# Patient Record
Sex: Male | Born: 1995 | Race: White | Hispanic: No | Marital: Single | Smoking: Never smoker
Health system: Southern US, Community
[De-identification: ages and names within clinical notes are randomized; demographics above are authoritative.]

## PROBLEM LIST (undated history)

## (undated) DIAGNOSIS — F329 Major depressive disorder, single episode, unspecified: Secondary | ICD-10-CM

## (undated) DIAGNOSIS — F419 Anxiety disorder, unspecified: Secondary | ICD-10-CM

## (undated) DIAGNOSIS — F32A Depression, unspecified: Secondary | ICD-10-CM

## (undated) HISTORY — DX: Anxiety disorder, unspecified: F41.9

## (undated) HISTORY — DX: Major depressive disorder, single episode, unspecified: F32.9

## (undated) HISTORY — DX: Depression, unspecified: F32.A

---

## 2015-05-12 ENCOUNTER — Ambulatory Visit (INDEPENDENT_AMBULATORY_CARE_PROVIDER_SITE_OTHER): Admitting: Family Medicine

## 2015-05-12 ENCOUNTER — Ambulatory Visit (INDEPENDENT_AMBULATORY_CARE_PROVIDER_SITE_OTHER)

## 2015-05-12 VITALS — BP 122/78 | HR 80 | Temp 98.3°F | Resp 16 | Ht 73.5 in | Wt 181.8 lb

## 2015-05-12 DIAGNOSIS — S161XXA Strain of muscle, fascia and tendon at neck level, initial encounter: Secondary | ICD-10-CM

## 2015-05-12 DIAGNOSIS — M25571 Pain in right ankle and joints of right foot: Secondary | ICD-10-CM

## 2015-05-12 DIAGNOSIS — S39012A Strain of muscle, fascia and tendon of lower back, initial encounter: Secondary | ICD-10-CM | POA: Diagnosis not present

## 2015-05-12 DIAGNOSIS — S93401A Sprain of unspecified ligament of right ankle, initial encounter: Secondary | ICD-10-CM

## 2015-05-12 MED ORDER — CYCLOBENZAPRINE HCL 5 MG PO TABS: ORAL_TABLET | ORAL | Status: DC
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
## 2015-05-12 NOTE — Patient Instructions (Signed)
advil or alleve as needed for pain, heat or ice to sore muscles. Flexeril (muscle relaxant) if needed at night.  Brace for ankle as needed, but keep it moving - see below.  Return to the clinic or go to the nearest emergency room if any of your symptoms worsen or new symptoms occur.  Acute Ankle Sprain With Phase I Rehab An acute ankle sprain is a partial or complete tear in one or more of the ligaments of the ankle due to traumatic injury. The severity of the injury depends on both the number of ligaments sprained and the grade of sprain. There are 3 grades of sprains.   A grade 1 sprain is a mild sprain. There is a slight pull without obvious tearing. There is no loss of strength, and the muscle and ligament are the correct length.  A grade 2 sprain is a moderate sprain. There is tearing of fibers within the substance of the ligament where it connects two bones or two cartilages. The length of the ligament is increased, and there is usually decreased strength.  A grade 3 sprain is a complete rupture of the ligament and is uncommon. In addition to the grade of sprain, there are three types of ankle sprains.  Lateral ankle sprains: This is a sprain of one or more of the three ligaments on the outer side (lateral) of the ankle. These are the most common sprains. Medial ankle sprains: There is one large triangular ligament of the inner side (medial) of the ankle that is susceptible to injury. Medial ankle sprains are less common. Syndesmosis, "high ankle," sprains: The syndesmosis is the ligament that connects the two bones of the lower leg. Syndesmosis sprains usually only occur with very severe ankle sprains. SYMPTOMS  Pain, tenderness, and swelling in the ankle, starting at the side of injury that may progress to the whole ankle and foot with time.  "Pop" or tearing sensation at the time of injury.  Bruising that may spread to the heel.  Impaired ability to walk soon after injury. CAUSES     Acute ankle sprains are caused by trauma placed on the ankle that temporarily forces or pries the anklebone (talus) out of its normal socket.  Stretching or tearing of the ligaments that normally hold the joint in place (usually due to a twisting injury). RISK INCREASES WITH:  Previous ankle sprain.  Sports in which the foot may land awkwardly (i.e., basketball, volleyball, or soccer) or walking or running on uneven or rough surfaces.  Shoes with inadequate support to prevent sideways motion when stress occurs.  Poor strength and flexibility.  Poor balance skills.  Contact sports. PREVENTION   Warm up and stretch properly before activity.  Maintain physical fitness:  Ankle and leg flexibility, muscle strength, and endurance.  Cardiovascular fitness.  Balance training activities.  Use proper technique and have a coach correct improper technique.  Taping, protective strapping, bracing, or high-top tennis shoes may help prevent injury. Initially, tape is best; however, it loses most of its support function within 10 to 15 minutes.  Wear proper-fitted protective shoes (High-top shoes with taping or bracing is more effective than either alone).  Provide the ankle with support during sports and practice activities for 12 months following injury. PROGNOSIS   If treated properly, ankle sprains can be expected to recover completely; however, the length of recovery depends on the degree of injury.  A grade 1 sprain usually heals enough in 5 to 7 days to allow modified  activity and requires an average of 6 weeks to heal completely.  A grade 2 sprain requires 6 to 10 weeks to heal completely.  A grade 3 sprain requires 12 to 16 weeks to heal.  A syndesmosis sprain often takes more than 3 months to heal. RELATED COMPLICATIONS   Frequent recurrence of symptoms may result in a chronic problem. Appropriately addressing the problem the first time decreases the frequency of  recurrence and optimizes healing time. Severity of the initial sprain does not predict the likelihood of later instability.  Injury to other structures (bone, cartilage, or tendon).  A chronically unstable or arthritic ankle joint is a possibility with repeated sprains. TREATMENT Treatment initially involves the use of ice, medication, and compression bandages to help reduce pain and inflammation. Ankle sprains are usually immobilized in a walking cast or boot to allow for healing. Crutches may be recommended to reduce pressure on the injury. After immobilization, strengthening and stretching exercises may be necessary to regain strength and a full range of motion. Surgery is rarely needed to treat ankle sprains. MEDICATION   Nonsteroidal anti-inflammatory medications, such as aspirin and ibuprofen (do not take for the first 3 days after injury or within 7 days before surgery), or other minor pain relievers, such as acetaminophen, are often recommended. Take these as directed by your caregiver. Contact your caregiver immediately if any bleeding, stomach upset, or signs of an allergic reaction occur from these medications.  Ointments applied to the skin may be helpful.  Pain relievers may be prescribed as necessary by your caregiver. Do not take prescription pain medication for longer than 4 to 7 days. Use only as directed and only as much as you need. HEAT AND COLD  Cold treatment (icing) is used to relieve pain and reduce inflammation for acute and chronic cases. Cold should be applied for 10 to 15 minutes every 2 to 3 hours for inflammation and pain and immediately after any activity that aggravates your symptoms. Use ice packs or an ice massage.  Heat treatment may be used before performing stretching and strengthening activities prescribed by your caregiver. Use a heat pack or a warm soak. SEEK IMMEDIATE MEDICAL CARE IF:   Pain, swelling, or bruising worsens despite treatment.  You  experience pain, numbness, discoloration, or coldness in the foot or toes.  New, unexplained symptoms develop (drugs used in treatment may produce side effects.) EXERCISES  PHASE I EXERCISES RANGE OF MOTION (ROM) AND STRETCHING EXERCISES - Ankle Sprain, Acute Phase I, Weeks 1 to 2 These exercises may help you when beginning to restore flexibility in your ankle. You will likely work on these exercises for the 1 to 2 weeks after your injury. Once your physician, physical therapist, or athletic trainer sees adequate progress, he or she will advance your exercises. While completing these exercises, remember:   Restoring tissue flexibility helps normal motion to return to the joints. This allows healthier, less painful movement and activity.  An effective stretch should be held for at least 30 seconds.  A stretch should never be painful. You should only feel a gentle lengthening or release in the stretched tissue. RANGE OF MOTION - Dorsi/Plantar Flexion  While sitting with your right / left knee straight, draw the top of your foot upwards by flexing your ankle. Then reverse the motion, pointing your toes downward.  Hold each position for __________ seconds.  After completing your first set of exercises, repeat this exercise with your knee bent. Repeat __________ times. Complete this  exercise __________ times per day.  RANGE OF MOTION - Ankle Alphabet  Imagine your right / left big toe is a pen.  Keeping your hip and knee still, write out the entire alphabet with your "pen." Make the letters as large as you can without increasing any discomfort. Repeat __________ times. Complete this exercise __________ times per day.  STRENGTHENING EXERCISES - Ankle Sprain, Acute -Phase I, Weeks 1 to 2 These exercises may help you when beginning to restore strength in your ankle. You will likely work on these exercises for 1 to 2 weeks after your injury. Once your physician, physical therapist, or athletic  trainer sees adequate progress, he or she will advance your exercises. While completing these exercises, remember:   Muscles can gain both the endurance and the strength needed for everyday activities through controlled exercises.  Complete these exercises as instructed by your physician, physical therapist, or athletic trainer. Progress the resistance and repetitions only as guided.  You may experience muscle soreness or fatigue, but the pain or discomfort you are trying to eliminate should never worsen during these exercises. If this pain does worsen, stop and make certain you are following the directions exactly. If the pain is still present after adjustments, discontinue the exercise until you can discuss the trouble with your clinician. STRENGTH - Dorsiflexors  Secure a rubber exercise band/tubing to a fixed object (i.e., table, pole) and loop the other end around your right / left foot.  Sit on the floor facing the fixed object. The band/tubing should be slightly tense when your foot is relaxed.  Slowly draw your foot back toward you using your ankle and toes.  Hold this position for __________ seconds. Slowly release the tension in the band and return your foot to the starting position. Repeat __________ times. Complete this exercise __________ times per day.  STRENGTH - Plantar-flexors   Sit with your right / left leg extended. Holding onto both ends of a rubber exercise band/tubing, loop it around the ball of your foot. Keep a slight tension in the band.  Slowly push your toes away from you, pointing them downward.  Hold this position for __________ seconds. Return slowly, controlling the tension in the band/tubing. Repeat __________ times. Complete this exercise __________ times per day.  STRENGTH - Ankle Eversion  Secure one end of a rubber exercise band/tubing to a fixed object (table, pole). Loop the other end around your foot just before your toes.  Place your fists between  your knees. This will focus your strengthening at your ankle.  Drawing the band/tubing across your opposite foot, slowly, pull your little toe out and up. Make sure the band/tubing is positioned to resist the entire motion.  Hold this position for __________ seconds. Have your muscles resist the band/tubing as it slowly pulls your foot back to the starting position.  Repeat __________ times. Complete this exercise __________ times per day.  STRENGTH - Ankle Inversion  Secure one end of a rubber exercise band/tubing to a fixed object (table, pole). Loop the other end around your foot just before your toes.  Place your fists between your knees. This will focus your strengthening at your ankle.  Slowly, pull your big toe up and in, making sure the band/tubing is positioned to resist the entire motion.  Hold this position for __________ seconds.  Have your muscles resist the band/tubing as it slowly pulls your foot back to the starting position. Repeat __________ times. Complete this exercises __________ times per day.  STRENGTH - Towel Curls  Sit in a chair positioned on a non-carpeted surface.  Place your right / left foot on a towel, keeping your heel on the floor.  Pull the towel toward your heel by only curling your toes. Keep your heel on the floor.  If instructed by your physician, physical therapist, or athletic trainer, add weight to the end of the towel. Repeat __________ times. Complete this exercise __________ times per day.   This information is not intended to replace advice given to you by your health care provider. Make sure you discuss any questions you have with your health care provider.   Document Released: 02/02/2005 Document Revised: 07/25/2014 Document Reviewed: 10/16/2008 [employer] Interactive Patient Education 2016 [employer].   Low Back Strain With Rehab A strain is an injury in which a tendon or muscle is torn. The muscles and tendons of the lower back  are vulnerable to strains. However, these muscles and tendons are very strong and require a great force to be injured. Strains are classified into three categories. Grade 1 strains cause pain, but the tendon is not lengthened. Grade 2 strains include a lengthened ligament, due to the ligament being stretched or partially ruptured. With grade 2 strains there is still function, although the function may be decreased. Grade 3 strains involve a complete tear of the tendon or muscle, and function is usually impaired. SYMPTOMS   Pain in the lower back.  Pain that affects one side more than the other.  Pain that gets worse with movement and may be felt in the hip, buttocks, or back of the thigh.  Muscle spasms of the muscles in the back.  Swelling along the muscles of the back.  Loss of strength of the back muscles.  Crackling sound (crepitation) when the muscles are touched. CAUSES  Lower back strains occur when a force is placed on the muscles or tendons that is greater than they can handle. Common causes of injury include:  Prolonged overuse of the muscle-tendon units in the lower back, usually from incorrect posture.  A single violent injury or force applied to the back. RISK INCREASES WITH:  Sports that involve twisting forces on the spine or a lot of bending at the waist (football, rugby, weightlifting, bowling, golf, tennis, speed skating, racquetball, swimming, running, gymnastics, diving).  Poor strength and flexibility.  Failure to warm up properly before activity.  Family history of lower back pain or disk disorders.  Previous back injury or surgery (especially fusion).  Poor posture with lifting, especially heavy objects.  Prolonged sitting, especially with poor posture. PREVENTION   Learn and use proper posture when sitting or lifting (maintain proper posture when sitting, lift using the knees and legs, not at the waist).  Warm up and stretch properly before  activity.  Allow for adequate recovery between workouts.  Maintain physical fitness:  Strength, flexibility, and endurance.  Cardiovascular fitness. PROGNOSIS  If treated properly, lower back strains usually heal within 6 weeks. RELATED COMPLICATIONS   Recurring symptoms, resulting in a chronic problem.  Chronic inflammation, scarring, and partial muscle-tendon tear.  Delayed healing or resolution of symptoms.  Prolonged disability. TREATMENT  Treatment first involves the use of ice and medicine, to reduce pain and inflammation. The use of strengthening and stretching exercises may help reduce pain with activity. These exercises may be performed at home or with a therapist. Severe injuries may require referral to a therapist for further evaluation and treatment, such as ultrasound. Your caregiver may  advise that you wear a back brace or corset, to help reduce pain and discomfort. Often, prolonged bed rest results in greater harm then benefit. Corticosteroid injections may be recommended. However, these should be reserved for the most serious cases. It is important to avoid using your back when lifting objects. At night, sleep on your back on a firm mattress with a pillow placed under your knees. If non-surgical treatment is unsuccessful, surgery may be needed.  MEDICATION   If pain medicine is needed, nonsteroidal anti-inflammatory medicines (aspirin and ibuprofen), or other minor pain relievers (acetaminophen), are often advised.  Do not take pain medicine for 7 days before surgery.  Prescription pain relievers may be given, if your caregiver thinks they are needed. Use only as directed and only as much as you need.  Ointments applied to the skin may be helpful.  Corticosteroid injections may be given by your caregiver. These injections should be reserved for the most serious cases, because they may only be given a certain number of times. HEAT AND COLD  Cold treatment (icing)  should be applied for 10 to 15 minutes every 2 to 3 hours for inflammation and pain, and immediately after activity that aggravates your symptoms. Use ice packs or an ice massage.  Heat treatment may be used before performing stretching and strengthening activities prescribed by your caregiver, physical therapist, or athletic trainer. Use a heat pack or a warm water soak. SEEK MEDICAL CARE IF:   Symptoms get worse or do not improve in 2 to 4 weeks, despite treatment.  You develop numbness, weakness, or loss of bowel or bladder function.  New, unexplained symptoms develop. (Drugs used in treatment may produce side effects.) EXERCISES  RANGE OF MOTION (ROM) AND STRETCHING EXERCISES - Low Back Strain Most people with lower back pain will find that their symptoms get worse with excessive bending forward (flexion) or arching at the lower back (extension). The exercises which will help resolve your symptoms will focus on the opposite motion.  Your physician, physical therapist or athletic trainer will help you determine which exercises will be most helpful to resolve your lower back pain. Do not complete any exercises without first consulting with your caregiver. Discontinue any exercises which make your symptoms worse until you speak to your caregiver.  If you have pain, numbness or tingling which travels down into your buttocks, leg or foot, the goal of the therapy is for these symptoms to move closer to your back and eventually resolve. Sometimes, these leg symptoms will get better, but your lower back pain may worsen. This is typically an indication of progress in your rehabilitation. Be very alert to any changes in your symptoms and the activities in which you participated in the 24 hours prior to the change. Sharing this information with your caregiver will allow him/her to most efficiently treat your condition.  These exercises may help you when beginning to rehabilitate your injury. Your symptoms  may resolve with or without further involvement from your physician, physical therapist or athletic trainer. While completing these exercises, remember:  Restoring tissue flexibility helps normal motion to return to the joints. This allows healthier, less painful movement and activity.  An effective stretch should be held for at least 30 seconds.  A stretch should never be painful. You should only feel a gentle lengthening or release in the stretched tissue. FLEXION RANGE OF MOTION AND STRETCHING EXERCISES: STRETCH - Flexion, Single Knee to Chest   Lie on a firm bed or floor  with both legs extended in front of you.  Keeping one leg in contact with the floor, bring your opposite knee to your chest. Hold your leg in place by either grabbing behind your thigh or at your knee.  Pull until you feel a gentle stretch in your lower back. Hold __________ seconds.  Slowly release your grasp and repeat the exercise with the opposite side. Repeat __________ times. Complete this exercise __________ times per day.  STRETCH - Flexion, Double Knee to Chest   Lie on a firm bed or floor with both legs extended in front of you.  Keeping one leg in contact with the floor, bring your opposite knee to your chest.  Tense your stomach muscles to support your back and then lift your other knee to your chest. Hold your legs in place by either grabbing behind your thighs or at your knees.  Pull both knees toward your chest until you feel a gentle stretch in your lower back. Hold __________ seconds.  Tense your stomach muscles and slowly return one leg at a time to the floor. Repeat __________ times. Complete this exercise __________ times per day.  STRETCH - Low Trunk Rotation  Lie on a firm bed or floor. Keeping your legs in front of you, bend your knees so they are both pointed toward the ceiling and your feet are flat on the floor.  Extend your arms out to the side. This will stabilize your upper body by  keeping your shoulders in contact with the floor.  Gently and slowly drop both knees together to one side until you feel a gentle stretch in your lower back. Hold for __________ seconds.  Tense your stomach muscles to support your lower back as you bring your knees back to the starting position. Repeat the exercise to the other side. Repeat __________ times. Complete this exercise __________ times per day  EXTENSION RANGE OF MOTION AND FLEXIBILITY EXERCISES: STRETCH - Extension, Prone on Elbows   Lie on your stomach on the floor, a bed will be too soft. Place your palms about shoulder width apart and at the height of your head.  Place your elbows under your shoulders. If this is too painful, stack pillows under your chest.  Allow your body to relax so that your hips drop lower and make contact more completely with the floor.  Hold this position for __________ seconds.  Slowly return to lying flat on the floor. Repeat __________ times. Complete this exercise __________ times per day.  RANGE OF MOTION - Extension, Prone Press Ups  Lie on your stomach on the floor, a bed will be too soft. Place your palms about shoulder width apart and at the height of your head.  Keeping your back as relaxed as possible, slowly straighten your elbows while keeping your hips on the floor. You may adjust the placement of your hands to maximize your comfort. As you gain motion, your hands will come more underneath your shoulders.  Hold this position __________ seconds.  Slowly return to lying flat on the floor. Repeat __________ times. Complete this exercise __________ times per day.  RANGE OF MOTION- Quadruped, Neutral Spine   Assume a hands and knees position on a firm surface. Keep your hands under your shoulders and your knees under your hips. You may place padding under your knees for comfort.  Drop your head and point your tail bone toward the ground below you. This will round out your lower back  like an angry cat.  Hold this position for __________ seconds.  Slowly lift your head and release your tail bone so that your back sags into a large arch, like an old horse.  Hold this position for __________ seconds.  Repeat this until you feel limber in your lower back.  Now, find your "sweet spot." This will be the most comfortable position somewhere between the two previous positions. This is your neutral spine. Once you have found this position, tense your stomach muscles to support your lower back.  Hold this position for __________ seconds. Repeat __________ times. Complete this exercise __________ times per day.  STRENGTHENING EXERCISES - Low Back Strain These exercises may help you when beginning to rehabilitate your injury. These exercises should be done near your "sweet spot." This is the neutral, low-back arch, somewhere between fully rounded and fully arched, that is your least painful position. When performed in this safe range of motion, these exercises can be used for people who have either a flexion or extension based injury. These exercises may resolve your symptoms with or without further involvement from your physician, physical therapist or athletic trainer. While completing these exercises, remember:   Muscles can gain both the endurance and the strength needed for everyday activities through controlled exercises.  Complete these exercises as instructed by your physician, physical therapist or athletic trainer. Increase the resistance and repetitions only as guided.  You may experience muscle soreness or fatigue, but the pain or discomfort you are trying to eliminate should never worsen during these exercises. If this pain does worsen, stop and make certain you are following the directions exactly. If the pain is still present after adjustments, discontinue the exercise until you can discuss the trouble with your caregiver. STRENGTHENING - Deep Abdominals, Pelvic Tilt  Lie  on a firm bed or floor. Keeping your legs in front of you, bend your knees so they are both pointed toward the ceiling and your feet are flat on the floor.  Tense your lower abdominal muscles to press your lower back into the floor. This motion will rotate your pelvis so that your tail bone is scooping upwards rather than pointing at your feet or into the floor.  With a gentle tension and even breathing, hold this position for __________ seconds. Repeat __________ times. Complete this exercise __________ times per day.  STRENGTHENING - Abdominals, Crunches   Lie on a firm bed or floor. Keeping your legs in front of you, bend your knees so they are both pointed toward the ceiling and your feet are flat on the floor. Cross your arms over your chest.  Slightly tip your chin down without bending your neck.  Tense your abdominals and slowly lift your trunk high enough to just clear your shoulder blades. Lifting higher can put excessive stress on the lower back and does not further strengthen your abdominal muscles.  Control your return to the starting position. Repeat __________ times. Complete this exercise __________ times per day.  STRENGTHENING - Quadruped, Opposite UE/LE Lift   Assume a hands and knees position on a firm surface. Keep your hands under your shoulders and your knees under your hips. You may place padding under your knees for comfort.  Find your neutral spine and gently tense your abdominal muscles so that you can maintain this position. Your shoulders and hips should form a rectangle that is parallel with the floor and is not twisted.  Keeping your trunk steady, lift your right hand no higher than your shoulder and then  your left leg no higher than your hip. Make sure you are not holding your breath. Hold this position __________ seconds.  Continuing to keep your abdominal muscles tense and your back steady, slowly return to your starting position. Repeat with the opposite arm  and leg. Repeat __________ times. Complete this exercise __________ times per day.  STRENGTHENING - Lower Abdominals, Double Knee Lift  Lie on a firm bed or floor. Keeping your legs in front of you, bend your knees so they are both pointed toward the ceiling and your feet are flat on the floor.  Tense your abdominal muscles to brace your lower back and slowly lift both of your knees until they come over your hips. Be certain not to hold your breath.  Hold __________ seconds. Using your abdominal muscles, return to the starting position in a slow and controlled manner. Repeat __________ times. Complete this exercise __________ times per day.  POSTURE AND BODY MECHANICS CONSIDERATIONS - Low Back Strain Keeping correct posture when sitting, standing or completing your activities will reduce the stress put on different body tissues, allowing injured tissues a chance to heal and limiting painful experiences. The following are general guidelines for improved posture. Your physician or physical therapist will provide you with any instructions specific to your needs. While reading these guidelines, remember:  The exercises prescribed by your provider will help you have the flexibility and strength to maintain correct postures.  The correct posture provides the best environment for your joints to work. All of your joints have less wear and tear when properly supported by a spine with good posture. This means you will experience a healthier, less painful body.  Correct posture must be practiced with all of your activities, especially prolonged sitting and standing. Correct posture is as important when doing repetitive low-stress activities (typing) as it is when doing a single heavy-load activity (lifting). RESTING POSITIONS Consider which positions are most painful for you when choosing a resting position. If you have pain with flexion-based activities (sitting, bending, stooping, squatting), choose a  position that allows you to rest in a less flexed posture. You would want to avoid curling into a fetal position on your side. If your pain worsens with extension-based activities (prolonged standing, working overhead), avoid resting in an extended position such as sleeping on your stomach. Most people will find more comfort when they rest with their spine in a more neutral position, neither too rounded nor too arched. Lying on a non-sagging bed on your side with a pillow between your knees, or on your back with a pillow under your knees will often provide some relief. Keep in mind, being in any one position for a prolonged period of time, no matter how correct your posture, can still lead to stiffness. PROPER SITTING POSTURE In order to minimize stress and discomfort on your spine, you must sit with correct posture. Sitting with good posture should be effortless for a healthy body. Returning to good posture is a gradual process. Many people can work toward this most comfortably by using various supports until they have the flexibility and strength to maintain this posture on their own. When sitting with proper posture, your ears will fall over your shoulders and your shoulders will fall over your hips. You should use the back of the chair to support your upper back. Your lower back will be in a neutral position, just slightly arched. You may place a small pillow or folded towel at the base of your lower  back for support.  When working at a desk, create an environment that supports good, upright posture. Without extra support, muscles tire, which leads to excessive strain on joints and other tissues. Keep these recommendations in mind: CHAIR:  A chair should be able to slide under your desk when your back makes contact with the back of the chair. This allows you to work closely.  The chair's height should allow your eyes to be level with the upper part of your monitor and your hands to be slightly lower  than your elbows. BODY POSITION  Your feet should make contact with the floor. If this is not possible, use a foot rest.  Keep your ears over your shoulders. This will reduce stress on your neck and lower back. INCORRECT SITTING POSTURES  If you are feeling tired and unable to assume a healthy sitting posture, do not slouch or slump. This puts excessive strain on your back tissues, causing more damage and pain. Healthier options include:  Using more support, like a lumbar pillow.  Switching tasks to something that requires you to be upright or walking.  Talking a brief walk.  Lying down to rest in a neutral-spine position. PROLONGED STANDING WHILE SLIGHTLY LEANING FORWARD  When completing a task that requires you to lean forward while standing in one place for a long time, place either foot up on a stationary 2-4 inch high object to help maintain the best posture. When both feet are on the ground, the lower back tends to lose its slight inward curve. If this curve flattens (or becomes too large), then the back and your other joints will experience too much stress, tire more quickly, and can cause pain. CORRECT STANDING POSTURES Proper standing posture should be assumed with all daily activities, even if they only take a few moments, like when brushing your teeth. As in sitting, your ears should fall over your shoulders and your shoulders should fall over your hips. You should keep a slight tension in your abdominal muscles to brace your spine. Your tailbone should point down to the ground, not behind your body, resulting in an over-extended swayback posture.  INCORRECT STANDING POSTURES  Common incorrect standing postures include a forward head, locked knees and/or an excessive swayback. WALKING Walk with an upright posture. Your ears, shoulders and hips should all line-up. PROLONGED ACTIVITY IN A FLEXED POSITION When completing a task that requires you to bend forward at your waist or lean  over a low surface, try to find a way to stabilize 3 out of 4 of your limbs. You can place a hand or elbow on your thigh or rest a knee on the surface you are reaching across. This will provide you more stability so that your muscles do not fatigue as quickly. By keeping your knees relaxed, or slightly bent, you will also reduce stress across your lower back. CORRECT LIFTING TECHNIQUES DO :   Assume a wide stance. This will provide you more stability and the opportunity to get as close as possible to the object which you are lifting.  Tense your abdominals to brace your spine. Bend at the knees and hips. Keeping your back locked in a neutral-spine position, lift using your leg muscles. Lift with your legs, keeping your back straight.  Test the weight of unknown objects before attempting to lift them.  Try to keep your elbows locked down at your sides in order get the best strength from your shoulders when carrying an object.  Always  ask for help when lifting heavy or awkward objects. INCORRECT LIFTING TECHNIQUES DO NOT:   Lock your knees when lifting, even if it is a small object.  Bend and twist. Pivot at your feet or move your feet when needing to change directions.  Assume that you can safely pick up even a paper clip without proper posture.   This information is not intended to replace advice given to you by your health care provider. Make sure you discuss any questions you have with your health care provider.   Document Released: 07/04/2005 Document Revised: 07/25/2014 Document Reviewed: 10/16/2008 Elsevier Interactive Patient Education 2016 [employer].   Tourist information centre manager It is common to have multiple bruises and sore muscles after a motor vehicle collision (MVC). These tend to feel worse for the first 24 hours. You may have the most stiffness and soreness over the first several hours. You may also feel worse when you wake up the first morning after your collision. After  this point, you will usually begin to improve with each day. The speed of improvement often depends on the severity of the collision, the number of injuries, and the location and nature of these injuries. HOME CARE INSTRUCTIONS  Put ice on the injured area.  Put ice in a plastic bag.  Place a towel between your skin and the bag.  Leave the ice on for 15-20 minutes, 3-4 times a day, or as directed by your health care provider.  Drink enough fluids to keep your urine clear or pale yellow. Do not drink alcohol.  Take a warm shower or bath once or twice a day. This will increase blood flow to sore muscles.  You may return to activities as directed by your caregiver. Be careful when lifting, as this may aggravate neck or back pain.  Only take over-the-counter or prescription medicines for pain, discomfort, or fever as directed by your caregiver. Do not use aspirin. This may increase bruising and bleeding. SEEK IMMEDIATE MEDICAL CARE IF:  You have numbness, tingling, or weakness in the arms or legs.  You develop severe headaches not relieved with medicine.  You have severe neck pain, especially tenderness in the middle of the back of your neck.  You have changes in bowel or bladder control.  There is increasing pain in any area of the body.  You have shortness of breath, light-headedness, dizziness, or fainting.  You have chest pain.  You feel sick to your stomach (nauseous), throw up (vomit), or sweat.  You have increasing abdominal discomfort.  There is blood in your urine, stool, or vomit.  You have pain in your shoulder (shoulder strap areas).  You feel your symptoms are getting worse. MAKE SURE YOU:  Understand these instructions.  Will watch your condition.  Will get help right away if you are not doing well or get worse.   This information is not intended to replace advice given to you by your health care provider. Make sure you discuss any questions you have with  your health care provider.   Document Released: 07/04/2005 Document Revised: 07/25/2014 Document Reviewed: 12/01/2010 Elsevier Interactive Patient Education [employer]

## 2015-06-04 ENCOUNTER — Ambulatory Visit

## 2015-06-15 ENCOUNTER — Ambulatory Visit (INDEPENDENT_AMBULATORY_CARE_PROVIDER_SITE_OTHER)

## 2015-06-15 ENCOUNTER — Ambulatory Visit (INDEPENDENT_AMBULATORY_CARE_PROVIDER_SITE_OTHER): Admitting: Family Medicine

## 2015-06-15 VITALS — BP 118/68 | HR 73 | Temp 97.9°F | Resp 16 | Ht 72.0 in | Wt 189.0 lb

## 2015-06-15 DIAGNOSIS — S93401D Sprain of unspecified ligament of right ankle, subsequent encounter: Secondary | ICD-10-CM | POA: Diagnosis not present

## 2015-06-15 DIAGNOSIS — M25571 Pain in right ankle and joints of right foot: Secondary | ICD-10-CM
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
## 2015-06-15 NOTE — Patient Instructions (Addendum)
Lets get you to orthopedics.  I would recommend to go back to wearing the lace up brace and ice/elevate the ankle whenever possible as we still want the healing to go down.  It is odd that you are still having so much swelling 5 weeks out so we will refer you to orthopedics - bring the cd of xrays and your brace with you.   Acute Ankle Sprain With Phase II Rehab An acute ankle sprain is a partial or complete tear in one or more of the ligaments of the ankle due to traumatic injury. The severity of the injury depends on both the number of ligaments sprained and the grade of sprain. There are 3 grades of sprains.  A grade 1 sprain is a mild sprain. There is a slight pull without obvious tearing. There is no loss of strength, and the muscle and ligament are the correct length.  A grade 2 sprain is a moderate sprain. There is tearing of fibers within the substance of the ligament where it connects two bones or two cartilages. The length of the ligament is increased, and there is usually decreased strength.  A grade 3 sprain is a complete rupture of the ligament and is uncommon. In addition to the grade of sprain, there are 3 types of ankle sprains.  Lateral ankle sprains. This is a sprain of one or more of the 3 ligaments on the outer side (lateral) of the ankle. These are the most common sprains. Medial ankle sprains. There is one large triangular ligament on the inner side (medial) of the ankle that is susceptible to injury. Medial ankle sprains are less common. Syndesmosis, "high ankle," sprains. The syndesmosis is the ligament that connects the two bones of the lower leg. Syndesmosis sprains usually only occur with very severe ankle sprains. SYMPTOMS  Pain, tenderness, and swelling in the ankle, starting at the side of injury that may progress to the whole ankle and foot with time.  "Pop" or tearing sensation at the time of injury.  Bruising that may spread to the heel.  Impaired ability to  walk soon after injury. CAUSES   Acute ankle sprains are caused by trauma placed on the ankle that temporarily forces or pries the anklebone (talus) out of its normal socket.  Stretching or tearing of the ligaments that normally hold the joint in place (usually due to a twisting injury). RISK INCREASES WITH:  Previous ankle sprain.  Sports in which the foot may land awkwardly (basketball, volleyball, soccer) or walking or running on uneven or rough surfaces.  Shoes with inadequate support to prevent sideways motion when stress occurs.  Poor strength and flexibility.  Poor balance skills.  Contact sports. PREVENTION  Warm up and stretch properly before activity.  Maintain physical fitness:  Ankle and leg flexibility, muscle strength, and endurance.  Cardiovascular fitness.  Balance training activities.  Use proper technique and have a coach correct improper technique.  Taping, protective strapping, bracing, or high-top tennis shoes may help prevent injury. Initially, tape is best. However, it loses most of its support function within 10 to 15 minutes.  Wear proper fitted protective shoes. Combining high-top shoes with taping or bracing is more effective than using either alone.  Provide the ankle with support during sports and practice activities for 12 months following injury. PROGNOSIS   If treated properly, ankle sprains can be expected to recover completely. However, the length of recovery depends on the degree of injury.  A grade 1 sprain usually  heals enough in 5 to 7 days to allow modified activity and requires an average of 6 weeks to heal completely.  A grade 2 sprain requires 6 to 10 weeks to heal completely.  A grade 3 sprain requires 12 to 16 weeks to heal.  A syndesmosis sprain often takes more than 3 months to heal. RELATED COMPLICATIONS   Frequent recurrence of symptoms may result in a chronic problem. Appropriately addressing the problem the first  time decreases the frequency of recurrence and optimizes healing time. Severity of initial sprain does not predict the likelihood of later instability.  Injury to other structures (bone, cartilage, or tendon).  Chronically unstable or arthritic ankle joint are possible with repeated sprains. TREATMENT Treatment initially involves the use of ice, medicine, and compression bandages to help reduce pain and inflammation. Ankle sprains are usually immobilized in a walking cast or boot to allow for healing. Crutches may be recommended to reduce pressure on the injury. After immobilization, strengthening and stretching exercises may be necessary to regain strength and a full range of motion. Surgery is rarely needed to treat ankle sprains. MEDICATION   Nonsteroidal anti-inflammatory medicines, such as aspirin and ibuprofen (do not take for the first 3 days after injury or within 7 days before surgery), or other minor pain relievers, such as acetaminophen, are often recommended. Take these as directed by your caregiver. Contact your caregiver immediately if any bleeding, stomach upset, or signs of an allergic reaction occur from these medicines.  Ointments applied to the skin may be helpful.  Pain relievers may be prescribed as necessary by your caregiver. Do not take prescription pain medicine for longer than 4 to 7 days. Use only as directed and only as much as you need. HEAT AND COLD  Cold treatment (icing) is used to relieve pain and reduce inflammation for acute and chronic cases. Cold should be applied for 10 to 15 minutes every 2 to 3 hours for inflammation and pain and immediately after any activity that aggravates your symptoms. Use ice packs or an ice massage.  Heat treatment may be used before performing stretching and strengthening activities prescribed by your caregiver. Use a heat pack or a warm soak. SEEK IMMEDIATE MEDICAL CARE IF:   Pain, swelling, or bruising worsens despite  treatment.  You experience pain, numbness, discoloration, or coldness in the foot or toes.  New, unexplained symptoms develop. (Drugs used in treatment may produce side effects.) EXERCISES  PHASE II EXERCISES RANGE OF MOTION (ROM) AND STRETCHING EXERCISES - Ankle Sprain, Acute-Phase II, Weeks 3 to 4 After your physician, physical therapist, or athletic trainer feels your knee has made progress significant enough to begin more advanced exercises, he or she may recommend completing some of the following exercises. Although each person heals at different rates, most people will be ready for these exercises between 3 and 4 weeks after their injury. Do not begin these exercises until you have your caregiver's permission. He or she may also advise you to continue with the exercises which you completed in Phase I of your rehabilitation. While completing these exercises, remember:   Restoring tissue flexibility helps normal motion to return to the joints. This allows healthier, less painful movement and activity.  An effective stretch should be held for at least 30 seconds.  A stretch should never be painful. You should only feel a gentle lengthening or release in the stretched tissue. RANGE OF MOTION - Ankle Plantar Flexion   Sit with your right / left leg  crossed over your opposite knee.  Use your opposite hand to pull the top of your foot and toes toward you.  You should feel a gentle stretch on the top of your foot/ankle. Hold this position for __________. Repeat __________ times. Complete __________ times per day.  RANGE OF MOTION - Ankle Eversion  Sit with your right / left ankle crossed over your opposite knee.  Grip your foot with your opposite hand, placing your thumb on the top of your foot and your fingers across the bottom of your foot.  Gently push your foot downward with a slight rotation so your littlest toes rise slightly  You should feel a gentle stretch on the inside of your  ankle. Hold the stretch for __________ seconds. Repeat __________ times. Complete this exercise __________ times per day.  RANGE OF MOTION - Ankle Inversion  Sit with your right / left ankle crossed over your opposite knee.  Grip your foot with your opposite hand, placing your thumb on the bottom of your foot and your fingers across the top of your foot.  Gently pull your foot so the smallest toe comes toward you and your thumb pushes the inside of the ball of your foot away from you.  You should feel a gentle stretch on the outside of your ankle. Hold the stretch for __________ seconds. Repeat __________ times. Complete this exercise __________ times per day.  STRETCH - Gastrocsoleus  Sit with your right / left leg extended. Holding onto both ends of a belt or towel, loop it around the ball of your foot.  Keeping your right / left ankle and foot relaxed and your knee straight, pull your foot and ankle toward you using the belt/towel.  You should feel a gentle stretch behind your calf or knee. Hold this position for __________ seconds. Repeat __________ times. Complete this stretch __________ times per day.  RANGE OF MOTION - Ankle Dorsiflexion, Active Assisted  Remove shoes and sit on a chair that is preferably not on a carpeted surface.  Place right / left foot under knee. Extend your opposite leg for support.  Keeping your heel down, slide your right / left foot back toward the chair until you feel a stretch at your ankle or calf. If you do not feel a stretch, slide your bottom forward to the edge of the chair while still keeping your heel down.  Hold this stretch for __________ seconds. Repeat __________ times. Complete this stretch __________ times per day.  STRETCH - Gastroc, Standing   Place hands on wall.  Extend right / left leg and place a folded washcloth under the arch of your foot for support. Keep the front knee somewhat bent.  Slightly point your toes inward on your  back foot.  Keeping your right / left heel on the floor and your knee straight, shift your weight toward the wall, not allowing your back to arch.  You should feel a gentle stretch in the calf. Hold this position for __________ seconds. Repeat __________ times. Complete this stretch __________ times per day. STRETCH - Soleus, Standing  Place hands on wall.  Extend right / left leg and place a folded washcloth under the arch of your foot for support. Keep the front knee somewhat bent.  Slightly point your toes inward on your back foot.  Keep your right / left heel on the floor, bend your back knee, and slightly shift your weight over the back leg so that you feel a gentle stretch  deep in your back calf.  Hold this position for __________ seconds. Repeat __________ times. Complete this stretch __________ times per day. STRETCH - Gastrocsoleus, Standing Note: This exercise can place a lot of stress on your foot and ankle. Please complete this exercise only if specifically instructed by your caregiver.   Place the ball of your right / left foot on a step, keeping your other foot firmly on the same step.  Hold on to the wall or a rail for balance.  Slowly lift your other foot, allowing your body weight to press your heel down over the edge of the step.  You should feel a stretch in your right / left calf.  Hold this position for __________ seconds.  Repeat this exercise with a slight bend in your knee. Repeat __________ times. Complete this stretch __________ times per day.  STRENGTHENING EXERCISES - Ankle Sprain, Acute-Phase II Around 3 to 4 weeks after your injury, you may progress to some of these exercises in your rehabilitation program. Do not begin these until you have your caregiver's permission. Although your condition has improved, the Phase I exercises will continue to be helpful and you may continue to complete them. As you complete strengthening exercises, remember:    Strong muscles with good endurance tolerate stress better.  Do the exercises as initially prescribed by your caregiver. Progress slowly with each exercise, gradually increasing the number of repetitions and weight used under his or her guidance.  You may experience muscle soreness or fatigue, but the pain or discomfort you are trying to eliminate should never worsen during these exercises. If this pain does worsen, stop and make certain you are following the directions exactly. If the pain is still present after adjustments, discontinue the exercise until you can discuss the trouble with your caregiver. STRENGTH - Plantar-flexors, Standing  Stand with your feet shoulder width apart. Steady yourself with a wall or table using as little support as needed.  Keeping your weight evenly spread over the width of your feet, rise up on your toes.*  Hold this position for __________ seconds. Repeat __________ times. Complete this exercise __________ times per day.  *If this is too easy, shift your weight toward your right / left leg until you feel challenged. Ultimately, you may be asked to do this exercise with your right / left foot only. STRENGTH - Dorsiflexors and Plantar-flexors, Heel/toe Walking  Dorsiflexion: Walk on your heels only. Keep your toes as high as possible.  Walk for ____________________ seconds/feet.  Repeat __________ times. Complete __________ times per day.  Plantar flexion: Walk on your toes only. Keep your heels as high as possible.  Walk for ____________________ seconds/feet. Repeat __________ times. Complete __________ times per day.  BALANCE - Tandem Walking  Place your uninjured foot on a line 2 to 4 inches wide and at least 10 feet long.  Keeping your balance without using anything for extra support, place your right / left heel directly in front of your other foot.  Slowly raise your back foot up, lifting from the heel to the toes, and place it directly in  front of the right / left foot.  Continue to walk along the line slowly. Walk for ____________________ feet. Repeat ____________________ times. Complete ____________________ times per day. BALANCE - Inversion/Eversion Use caution, these are advanced level exercises. Do not begin them until you are advised to do so.   Create a balance board using a sturdy board about 1  feet long and at 1  to 1  feet wide and a 1  inch diameter rod or pipe that is as long as the board's width. A copper pipe or a solid broomstick work well.  Stand on a non-carpeted surface near a countertop or wall. Step onto the board so that your feet are hip-width apart and equally straddle the rod/pipe.  Keeping your feet in place, complete these two exercises without shifting your upper body or hips:  Tip the board from side-to-side. Control the movement so the board does not forcefully strike the ground. The board should silently tap the ground.  Tip the board side-to-side without striking the ground. Occasionally pause and maintain a steady position at various points.  Repeat the first two exercises, but use only your right / left foot. Place your right / left foot directly over the rod/pipe. Repeat __________ times. Complete this exercise __________ times a day. BALANCE - Plantar/Dorsi Flexion Use caution, these are advanced level exercises. Do not begin them until you are advised to do so.   Create a balance board using a sturdy board about 1  feet long and at 1 to 1  feet wide and a 1  inch diameter rod or pipe that is as long as the board's width. A copper pipe or a solid broomstick work well.  Stand on a non-carpeted surface near a countertop or wall. Stand on the board so that the rod/pipe runs under the arches in your feet.  Keeping your feet in place, complete these two exercises without shifting your upper body or hips:  Tip the board from side-to-side. Control the movement so the board does not  forcefully strike the ground. The board should silently tap the ground.  Tip the board side-to-side without striking the ground. Occasionally pause and maintain a steady position at various points.  Repeat the first two exercises, but use only your right / left foot. Stand in the center of the board. Repeat __________ times. Complete this exercise __________ times a day. STRENGTH - Plantar-flexors, Eccentric Note: This exercise can place a lot of stress on your foot and ankle. Please complete this exercise only if specifically instructed by your caregiver.   Place the balls of your feet on a step. With your hands, use only enough support from a wall or rail to keep your balance.  Keep your knees straight and rise up on your toes.  Slowly shift your weight entirely to your toes and pick up your opposite foot. Gently and with controlled movement, lower your weight through your right / left foot so that your heel drops below the level of the step. You will feel a slight stretch in the back of your calf at the ending position.  Use the healthy leg to help rise up onto the balls of both feet, then lower weight only on the right / left leg again. Build up to 15 repetitions. Then progress to 3 consecutive sets of 15 repetitions.*  After completing the above exercise, complete the same exercise with a slight knee bend (about 30 degrees). Again, build up to 15 repetitions. Then progress to 3 consecutive sets of 15 repetitions.* Perform this exercise __________ times per day.  *When you easily complete 3 sets of 15, your physician, physical therapist, or athletic trainer may advise you to add resistance by wearing a backpack filled with additional weight.   This information is not intended to replace advice given to you by your health care provider. Make sure you discuss any questions  you have with your health care provider.   Document Released: 10/24/2005 Document Revised: 07/25/2014 Document Reviewed:  10/16/2008 [employer] Interactive Patient Education [employer]
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
## 2020-07-02 DIAGNOSIS — F411 Generalized anxiety disorder: Secondary | ICD-10-CM | POA: Diagnosis not present

## 2020-07-02 DIAGNOSIS — F3181 Bipolar II disorder: Secondary | ICD-10-CM | POA: Diagnosis not present

## 2020-07-02 DIAGNOSIS — F410 Panic disorder [episodic paroxysmal anxiety] without agoraphobia: Secondary | ICD-10-CM | POA: Diagnosis not present

## 2020-08-06 DIAGNOSIS — F3181 Bipolar II disorder: Secondary | ICD-10-CM | POA: Diagnosis not present

## 2020-08-06 DIAGNOSIS — F411 Generalized anxiety disorder: Secondary | ICD-10-CM | POA: Diagnosis not present

## 2020-08-11 DIAGNOSIS — H5213 Myopia, bilateral: Secondary | ICD-10-CM | POA: Diagnosis not present

## 2020-09-07 DIAGNOSIS — F3181 Bipolar II disorder: Secondary | ICD-10-CM | POA: Diagnosis not present

## 2020-09-07 DIAGNOSIS — F410 Panic disorder [episodic paroxysmal anxiety] without agoraphobia: Secondary | ICD-10-CM | POA: Diagnosis not present

## 2020-09-07 DIAGNOSIS — F411 Generalized anxiety disorder: Secondary | ICD-10-CM | POA: Diagnosis not present

## 2021-03-15 DIAGNOSIS — F3181 Bipolar II disorder: Secondary | ICD-10-CM | POA: Diagnosis not present

## 2021-03-15 DIAGNOSIS — F410 Panic disorder [episodic paroxysmal anxiety] without agoraphobia: Secondary | ICD-10-CM | POA: Diagnosis not present

## 2021-03-15 DIAGNOSIS — F411 Generalized anxiety disorder: Secondary | ICD-10-CM | POA: Diagnosis not present

## 2021-03-15 DIAGNOSIS — F5101 Primary insomnia: Secondary | ICD-10-CM | POA: Diagnosis not present

## 2021-09-14 DIAGNOSIS — F410 Panic disorder [episodic paroxysmal anxiety] without agoraphobia: Secondary | ICD-10-CM | POA: Diagnosis not present

## 2021-09-14 DIAGNOSIS — F411 Generalized anxiety disorder: Secondary | ICD-10-CM | POA: Diagnosis not present

## 2021-09-14 DIAGNOSIS — Z13220 Encounter for screening for lipoid disorders: Secondary | ICD-10-CM | POA: Diagnosis not present

## 2021-09-14 DIAGNOSIS — Z0000 Encounter for general adult medical examination without abnormal findings: Secondary | ICD-10-CM | POA: Diagnosis not present

## 2021-09-14 DIAGNOSIS — F5101 Primary insomnia: Secondary | ICD-10-CM | POA: Diagnosis not present

## 2021-11-29 DIAGNOSIS — H5213 Myopia, bilateral: Secondary | ICD-10-CM | POA: Diagnosis not present

## 2022-04-22 DIAGNOSIS — F410 Panic disorder [episodic paroxysmal anxiety] without agoraphobia: Secondary | ICD-10-CM | POA: Diagnosis not present

## 2022-04-22 DIAGNOSIS — F3181 Bipolar II disorder: Secondary | ICD-10-CM | POA: Diagnosis not present

## 2022-04-22 DIAGNOSIS — F411 Generalized anxiety disorder: Secondary | ICD-10-CM | POA: Diagnosis not present

## 2022-04-22 DIAGNOSIS — J3089 Other allergic rhinitis: Secondary | ICD-10-CM | POA: Diagnosis not present

## 2022-04-22 DIAGNOSIS — F5101 Primary insomnia: Secondary | ICD-10-CM | POA: Diagnosis not present

## 2022-10-06 DIAGNOSIS — M778 Other enthesopathies, not elsewhere classified: Secondary | ICD-10-CM | POA: Diagnosis not present

## 2022-10-06 DIAGNOSIS — R52 Pain, unspecified: Secondary | ICD-10-CM | POA: Diagnosis not present

## 2022-10-06 DIAGNOSIS — Z1330 Encounter for screening examination for mental health and behavioral disorders, unspecified: Secondary | ICD-10-CM | POA: Diagnosis not present

## 2022-10-06 NOTE — Progress Notes (Signed)
 PATIENT: Mark Potter MRN:  [id number] DOB:  [birth year] DATE OF SERVICE:  10/06/2022  Referring Physician:  No ref. provider found Primary Physician:  No primary care provider on file.  Chief Complaint:  Chief Complaint  Patient presents with  . Arm Problem    RIGHT elbow,forearm, wrist and thumb fell 02/2022    SUBJECTIVE: Mark Potter is a 27 y.o. male who presents today for evaluation of right wrist pain.  This is gone on for several months.  Patient reports a fall in August 2023.  He localizes the pain to the more radial aspect of his wrist.  He denies any numbness or tingling.  Current Outpatient Medications  Medication Sig Dispense Refill  . lamoTRIgine (LAMICTAL) 25 mg tablet Take one tablet (25 mg dose) by mouth daily.    . meloxicam (MOBIC) 15 mg tablet Take one tablet (15 mg dose) by mouth daily. 30 tablet 1  . minocycline HCl (MINOCIN) 50 mg capsule Take one capsule (50 mg dose) by mouth 2 (two) times daily.     No current facility-administered medications for this visit.   Past Medical History:  Diagnosis Date  . Allergy   . Anxiety   . Depression    bipolar depression   Past Surgical History:  Procedure Laterality Date  . Tonsillectomy and adenoidectomy     Social History   Social History Narrative  . Not on file   Allergies  Allergen Reactions  . Cinnamon Itching    Review of Systems:See below for the nursing review of systems which I have reviewed, edited and corrected.  Review of Systems  Musculoskeletal:  Positive for joint pain.  All other systems reviewed and are negative.    VITALS: Ht 6' (1.829 m)   Wt 230 lb (104.3 kg)   BMI 31.19 kg/m   PHYSICAL EXAM:  The patient is a well nourished, well developed in no acute distress. Alert, oriented/interactive. Breathing is non labored.  Examination of the involved extremity reveals the skin to be intact and there is no adenopathy.  Examination today of the right wrist reveals no erythema,  warmth or swelling.  He has no ulnar-sided tenderness.  He is maximally tender over the area of the FCR.  Negative provocative symptoms at the carpal tunnel.  Negative Finklestein's test.  He is neurovascularly intact.  XRAYS obtained in the office today and personally interpreted by me include Xr Wrist 3+ Views Right Narrative: AP lateral oblique views of the right wrist reveal no degenerative changes  and no acute or posttraumatic abnormalities. Impression: Normal films.      ASSESSMENT:  1. Pain  Xr Wrist 3+ Views Right    2. Tendinitis of right wrist         PLAN: I discussed the condition, exam and imaging studies with the patient/parents/family. I discussed treatment options.  I discussed the condition with the patient.  We will have him do intermittent splinting with a cock-up wrist splint.  We will place him on anti-inflammatory specifically meloxicam.  He denies contraindication and side effects and precautions were discussed.  He will follow-up in 4 weeks if he has not improved we discussed the possibility of a injection.       This note was dictated with voice recognition software. Similar sounding words can inadvertently be transcribed and not corrected upon review.    Author:  Lonni JINNY Reilly, MD 10/18/2022 10:40 AM

## 2022-12-07 DIAGNOSIS — H1012 Acute atopic conjunctivitis, left eye: Secondary | ICD-10-CM | POA: Diagnosis not present

## 2022-12-14 DIAGNOSIS — H5213 Myopia, bilateral: Secondary | ICD-10-CM | POA: Diagnosis not present

## 2023-04-24 DIAGNOSIS — S61211A Laceration without foreign body of left index finger without damage to nail, initial encounter: Secondary | ICD-10-CM | POA: Diagnosis not present

## 2023-04-27 DIAGNOSIS — F411 Generalized anxiety disorder: Secondary | ICD-10-CM | POA: Diagnosis not present

## 2023-04-27 DIAGNOSIS — J3089 Other allergic rhinitis: Secondary | ICD-10-CM | POA: Diagnosis not present

## 2023-04-27 DIAGNOSIS — F3181 Bipolar II disorder: Secondary | ICD-10-CM | POA: Diagnosis not present

## 2023-04-27 DIAGNOSIS — F5101 Primary insomnia: Secondary | ICD-10-CM | POA: Diagnosis not present

## 2023-10-25 DIAGNOSIS — H6691 Otitis media, unspecified, right ear: Secondary | ICD-10-CM | POA: Diagnosis not present

## 2024-01-17 DIAGNOSIS — E782 Mixed hyperlipidemia: Secondary | ICD-10-CM | POA: Diagnosis not present

## 2024-01-17 DIAGNOSIS — E559 Vitamin D deficiency, unspecified: Secondary | ICD-10-CM | POA: Diagnosis not present

## 2024-01-17 DIAGNOSIS — F5101 Primary insomnia: Secondary | ICD-10-CM | POA: Diagnosis not present

## 2024-01-17 DIAGNOSIS — Z0000 Encounter for general adult medical examination without abnormal findings: Secondary | ICD-10-CM | POA: Diagnosis not present

## 2024-01-17 DIAGNOSIS — F3181 Bipolar II disorder: Secondary | ICD-10-CM | POA: Diagnosis not present

## 2024-01-17 DIAGNOSIS — J3089 Other allergic rhinitis: Secondary | ICD-10-CM | POA: Diagnosis not present

## 2024-01-17 DIAGNOSIS — F411 Generalized anxiety disorder: Secondary | ICD-10-CM | POA: Diagnosis not present

## 2024-06-12 DIAGNOSIS — H66005 Acute suppurative otitis media without spontaneous rupture of ear drum, recurrent, left ear: Secondary | ICD-10-CM | POA: Diagnosis not present

## 2024-06-21 ENCOUNTER — Encounter (INDEPENDENT_AMBULATORY_CARE_PROVIDER_SITE_OTHER): Payer: Self-pay

## 2024-06-21 ENCOUNTER — Ambulatory Visit (INDEPENDENT_AMBULATORY_CARE_PROVIDER_SITE_OTHER)

## 2024-06-21 VITALS — BP 143/77 | HR 60 | Ht 72.0 in | Wt 230.0 lb

## 2024-06-21 DIAGNOSIS — H6523 Chronic serous otitis media, bilateral: Secondary | ICD-10-CM | POA: Diagnosis not present

## 2024-06-21 DIAGNOSIS — H73893 Other specified disorders of tympanic membrane, bilateral: Secondary | ICD-10-CM

## 2024-06-21 DIAGNOSIS — H6123 Impacted cerumen, bilateral: Secondary | ICD-10-CM | POA: Diagnosis not present

## 2024-06-21 DIAGNOSIS — H6993 Unspecified Eustachian tube disorder, bilateral: Secondary | ICD-10-CM | POA: Diagnosis not present

## 2024-06-21 DIAGNOSIS — H9191 Unspecified hearing loss, right ear: Secondary | ICD-10-CM

## 2024-06-21 MED ORDER — FLUTICASONE PROPIONATE 50 MCG/ACT NA SUSP: 2.0000 | Freq: Every day | NASAL | Status: AC

## 2024-06-21 NOTE — Progress Notes (Signed)
 Dear Dr. Marvene, Here is my assessment for our mutual patient, Mark Potter. Thank you for allowing me the opportunity to care for your patient. Please do not hesitate to contact me should you have any other questions. Sincerely, Dr. Hadassah Potter  Otolaryngology Clinic Note Referring provider: Dr. Marvene HPI:   Initial HPI (06/21/2024) Discussed the use of AI scribe software for clinical note transcription with the patient, who gave verbal consent to proceed.  History of Present Illness Mark Potter is a 28 year old male with chronic ear infections who presents with recurrent ear infections and hearing issues.  Reports long history of ear problems and infections. Typically one infection per year but this year has had them more clustered. Both ears affected. Last infection was in left ear recently. Since then he has had muffled hearing and increased tinnitus on left. He has mild tinnitus bilaterally at baseline but now worse on left.   Able to pop his ears and he does this often to relieve pressure  No current sx in right ear.   No drainage from ear.  No ear tube placement as child but he did have a lot of infections.  T&A as a child.     Independent Review of Additional Tests or Records:  Referral note Mark Marvene, FNP 06/20/24: ear infection on left 06/11/24 and hasn't been able to hear out of that ear since. Pt requesting ENT referral    PMH/Meds/All/SocHx/FamHx/ROS:   Past Medical History:  Diagnosis Date   Anxiety    Depression      No past surgical history on file.  No family history on file.   Social Connections: Unknown (09/22/2022)   Received from [facility]   Social Network    Social Network: Not on file     Current Outpatient Medications  Medication Instructions   lamoTRIgine (LAMICTAL XR) 50 mg, Daily   lamoTRIgine (LAMICTAL) 25 mg, Oral, Daily   minocycline (MINOCIN) 50 mg, Oral, 2 times daily   montelukast (SINGULAIR) 10 mg, Daily   traZODone  (DESYREL) 50 mg, Daily at bedtime     Physical Exam:   BP (!) 143/77 (BP Location: Right Arm, Patient Position: Sitting) Comment: medication can cause his BP to raise up this is a normal for him  Pulse 60   Ht 6' (1.829 m)   Wt 230 lb (104.3 kg)   SpO2 95%   BMI 31.19 kg/m   Salient findings:  CN II-XII intact Given history and complaints, ear microscopy was indicated and performed for evaluation with findings as below in physical exam section and in procedures Left EAC with cerumen impaction. This was removed. TM intact but retracted and with fluid present in middle ear space.  Right EAC with cerumen impaction. This was removed. TM intact but retracted and with fluid present in middle ear No lesions of oral cavity/oropharynx No obviously palpable neck masses/lymphadenopathy/thyromegaly No respiratory distress or stridor  Seprately Identifiable Procedures:  Prior to initiating any procedures, risks/benefits/alternatives were explained to the patient and verbal consent obtained.  Procedure (06/21/2024): Bilateral ear microscopy and cerumen removal using microscope (CPT 69210) - Mod 25 - 50 Pre-procedure diagnosis: Bilateral Cerumen impaction  Post-procedure diagnosis: same Indication: Bilateral cerumen impaction; given patient's otologic complaints and history as well as for improved and comprehensive examination of external ear and tympanic membrane, bilateral otologic examination using microscope was performed and impacted cerumen removed  Procedure: Patient was placed semi-recumbent. Both ear canals were examined using the microscope with findings above.  Cerumen removed on left and on right using suction and currette with improvement in EAC examination and patency.  Left EAC with cerumen impaction. This was removed. TM intact but retracted and with fluid present in middle ear space.  Right EAC with cerumen impaction. This was removed. TM intact but retracted and with fluid present  in middle ear    Impression & Plans:  Ralf Konopka is a 28 y.o. male with   1. Tympanic membrane retraction, bilateral   2. Hearing loss of right ear, unspecified hearing loss type   3. Dysfunction of both eustachian tubes   4. Bilateral chronic serous otitis media    Assessment and Plan Assessment & Plan Bilateral eustachian tube dysfunction  Bilateral chronic serous otitis media Bilateral tympanic membrane retraction  Bilateral eustachian tube dysfunction with middle ear effusion and retraction of eardrums. No current infection, but risk of future infections and potential for cholesteatoma formation due to retraction. Hearing is muffled, particularly on the left side. Discussed potential need for ear tubes to equalize pressure and prevent future complications.  Would first get audiogram. He is open to the procedure to prevent future infections and complications but will research more at home.  - Ordered audiogram to assess hearing status and f/u after. Discuss audiogram at next visit and possible tube placement bilaterally  - Advised use of Flonase  nasal spray daily for two weeks to reduce eustachian tube swelling and potentially alleviate muffling.  Bilateral tinnitus Chronic bilateral tinnitus, exacerbated on the left side during recent ear infection. No acute changes noted during examination. - Continue to monitor tinnitus symptoms in conjunction with management of eustachian tube dysfunction and otitis media.  See below regarding exact medications prescribed this encounter including dosages and route: No orders of the defined types were placed in this encounter.   Thank you for allowing me the opportunity to care for your patient. Please do not hesitate to contact me should you have any other questions.  Sincerely, Mark Parody, MD Otolaryngologist (ENT), [facility] Phone: [phone] Fax: [phone]  MDM:  Level 4 Complexity/Problems addressed:  chronic worsening problem Data complexity: low independent review of referral note - Morbidity: 4  - Prescription Drug prescribed or managed: yes

## 2024-08-09 ENCOUNTER — Ambulatory Visit (INDEPENDENT_AMBULATORY_CARE_PROVIDER_SITE_OTHER): Admitting: Audiology

## 2024-08-09 ENCOUNTER — Ambulatory Visit (INDEPENDENT_AMBULATORY_CARE_PROVIDER_SITE_OTHER)

## 2024-08-09 ENCOUNTER — Encounter (INDEPENDENT_AMBULATORY_CARE_PROVIDER_SITE_OTHER): Payer: Self-pay

## 2024-08-09 VITALS — BP 149/79 | HR 68 | Wt 230.0 lb

## 2024-08-09 DIAGNOSIS — H90A11 Conductive hearing loss, unilateral, right ear with restricted hearing on the contralateral side: Secondary | ICD-10-CM

## 2024-08-09 DIAGNOSIS — H90A12 Conductive hearing loss, unilateral, left ear with restricted hearing on the contralateral side: Secondary | ICD-10-CM | POA: Diagnosis not present

## 2024-08-09 DIAGNOSIS — H6993 Unspecified Eustachian tube disorder, bilateral: Secondary | ICD-10-CM

## 2024-08-09 DIAGNOSIS — H6991 Unspecified Eustachian tube disorder, right ear: Secondary | ICD-10-CM

## 2024-08-09 DIAGNOSIS — H73891 Other specified disorders of tympanic membrane, right ear: Secondary | ICD-10-CM | POA: Diagnosis not present

## 2024-08-09 DIAGNOSIS — H65493 Other chronic nonsuppurative otitis media, bilateral: Secondary | ICD-10-CM

## 2024-08-09 NOTE — Progress Notes (Signed)
" °  [address], [address] [place], [place] [address] [phone]  Audiological Evaluation    Name: Mark Potter     DOB:   [birth year]      MRN:   [id number]                                                                                     Service Date: 08/09/2024     Accompanied by: self    Patient comes today after Dr. Greggory, ENT sent a referral for a hearing evaluation due to concerns with recurrent ear infections.   Symptoms Yes Details  Hearing loss  []    Tinnitus  [x]  Both ears since he was a teenager - the left also has a low pitched sound  Ear pain/ infections/pressure  [x]  Multiple ar infections since a child. Right ear pain- sometimes  Balance problems  [x]  Floating sensation- minutes  Noise exposure history  [x]  music  Previous ear surgeries  []     Family history of hearing loss  []    Amplification  []    Other  []      Otoscopy: Right ear: Clear external ear canal and retracted tympanic membrane. Left ear:  Abnormal eardrum appearance.  Tympanometry: Right ear: Type C - Normal external ear canal volume with negative middle ear pressure and normal or reduced tympanic membrane compliance. Findings are consistent with Eustachian tube dysfunction.   Left ear: Type B - Normal external ear canal volume with no middle ear pressure peak or tympanic membrane compliance.  Findings are consistent with abnormal middle ear function.    Hearing Evaluation The hearing test results were completed under headphones and results are deemed to be of good reliability. Test technique:  conventional    Pure tone Audiometry: Both ears- Normal hearing from [phone] Hz,with air-bone gaps (conductive component) in the right ear.   Speech Audiometry: Right ear- Speech Reception Threshold (SRT) was obtained at 15 dBHL. Left ear-Speech Reception Threshold (SRT) was obtained at 15 dBHL.   Word Recognition Score Tested using NU-6 (recorded) Right ear: 100% was obtained at a  presentation level of 50 dBHL with contralateral masking which is deemed as  excellent. Left ear: 96% was obtained at a presentation level of 50 dBHL with contralateral masking which is deemed as  excellent.     Recommendations: Follow up with ENT as scheduled. Repeat audiogram after medical care.   Derrisha Foos MARIE LEROUX-MARTINEZ, AUD  "

## 2024-08-09 NOTE — Progress Notes (Signed)
 Dear Dr. Marvene, Here is my assessment for our mutual patient, Mark Potter. Thank you for allowing me the opportunity to care for your patient. Please do not hesitate to contact me should you have any other questions. Sincerely, Dr. Hadassah Parody  Otolaryngology Clinic Note Referring provider: Dr. Marvene HPI:   Initial HPI (06/21/2024) Mark Potter is a 29 year old male with chronic ear infections who presents with recurrent ear infections and hearing issues.  Reports long history of ear problems and infections. Typically one infection per year but this year has had them more clustered. Both ears affected. Last infection was in left ear recently. Since then he has had muffled hearing and increased tinnitus on left. He has mild tinnitus bilaterally at baseline but now worse on left.   Able to pop his ears and he does this often to relieve pressure  No current sx in right ear.   No drainage from ear.  No ear tube placement as child but he did have a lot of infections.  T&A as a child.   --------------------------------------------------------- 08/09/2024  Here for f/u with audiogram, possible tubes.  Since he was last seen his symptoms have improved.  Subjective hearing loss has resolved.  No fullness or pain.    Independent Review of Additional Tests or Records:  Referral note Prentice Marvene, FNP 06/20/24: ear infection on left 06/11/24 and hasn't been able to hear out of that ear since. Pt requesting ENT referral   08/09/24 Audiogram was independently reviewed and interpreted by me and it reveals Right ear: Borderline mild conductive loss; 100% word interpretation at 65dB; type C tympanogram Left ear: Borderline mild sensorineural loss; 96% word interpretation at 100dB; type B tympanogram      PMH/Meds/All/SocHx/FamHx/ROS:   Past Medical History:  Diagnosis Date   Anxiety    Depression      History reviewed. No pertinent surgical history.  History reviewed. No pertinent  family history.   Social Connections: Unknown (09/22/2022)   Received from [facility]   Social Network    Social Network: Not on file     Current Outpatient Medications  Medication Instructions   fluticasone  (FLONASE ) 50 MCG/ACT nasal spray 2 sprays, Each Nare, Daily   lamoTRIgine (LAMICTAL XR) 50 mg, Daily   lamoTRIgine (LAMICTAL) 25 mg, Daily   minocycline (MINOCIN) 50 mg, 2 times daily   montelukast (SINGULAIR) 10 mg, Daily   traZODone (DESYREL) 50 mg, Daily at bedtime     Physical Exam:   BP (!) 149/79 (BP Location: Left Arm, Patient Position: Sitting, Cuff Size: Normal)   Pulse 68   Wt 230 lb (104.3 kg)   SpO2 98%   BMI 31.19 kg/m   Salient findings:  CN II-XII intact  Given history and complaints, ear microscopy was indicated and performed for evaluation with findings as below in physical exam section and in procedures Left EAC clear.  TM intact with serous effusion present. Right EAC clear.  TM intact but retracted and with fluid present in middle ear  No lesions of oral cavity/oropharynx No obviously palpable neck masses/lymphadenopathy/thyromegaly No respiratory distress or stridor  Seprately Identifiable Procedures:  Prior to initiating any procedures, risks/benefits/alternatives were explained to the patient and verbal consent obtained.  Procedure (08/09/2024): Bilateral ear microscopy using microscope (CPT [id number]) Pre-procedure diagnosis: Bilateral middle ear effusion Post-procedure diagnosis: same Indication: see above; given patient's otologic complaints and history, for improved and comprehensive examination of external ear and tympanic membrane, bilateral otologic examination using microscope was performed.  Prior to proceeding, verbal consent was obtained after discussion of R/B/A  Procedure: Patient was placed semi-recumbent. Both ear canals were examined using the microscope with findings above. Patient tolerated the procedure well.     Impression &  Plans:  Ra Pfiester is a 29 y.o. male with   1. Chronic MEE (middle ear effusion), bilateral   2. 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 He has residual middle ear effusion on exam but he reports no symptoms from this.  Discussed tympanostomy tube placement.  He would like to wait on this given that he is asymptomatic.  I think this is a safe option. - Recommended continued use of nasal spray to manage congestion and minimize symptoms. - Advised return for evaluation if he develops significant otalgia, aural fullness, or worsening symptoms, particularly during allergy season.  Tympanic membrane retraction, right ear Mild retraction of the right tympanic membrane with evidence of negative middle ear pressure and type C tympanogram. He remains asymptomatic, without otalgia or aural fullness. There is risk for long-term complications, including cholesteatoma, if retraction progresses.  Discussed tube placement again for the retraction.  Patient would rather continue observation.  Will plan follow-up for him and will monitor his retraction, ensure it does not worsen. - Discussed tympanostomy tube placement as a potential intervention if retraction worsens or symptoms develop.  Conductive hearing loss, right ear Mild conductive hearing loss on the right, likely secondary to middle ear effusion and tympanic membrane retraction. Hearing is currently comparable bilaterally and does not impact daily function. - Reviewed recent audiogram findings. - Recommended continued observation and annual monitoring, with earlier intervention if hearing deteriorates or symptoms emerge.  If it anytime he develops fullness or pain, ear tubes would  be an appropriate option for him.  See below regarding exact medications prescribed this encounter including dosages and route: No orders of the defined types were placed in this encounter.   I personally spent a total of 45 minutes in the care of the patient today including preparing to see the patient, discussing audiogram results with audiologist, discussing audiogram with patient and current symptoms, joint decision making regarding observation versus tympanostomy tube placement.  Thank you for allowing me the opportunity to care for your patient. Please do not hesitate to contact me should you have any other questions.  Sincerely, Hadassah Parody, MD Otolaryngologist (ENT), [facility] Phone: [phone] Fax: [phone]
